# Patient Record
Sex: Female | Born: 2005 | Race: White | Hispanic: No | Marital: Single | Smoking: Never smoker
Health system: Southern US, Community
[De-identification: ages and names within clinical notes are randomized; demographics above are authoritative.]

## PROBLEM LIST (undated history)

## (undated) DIAGNOSIS — L709 Acne, unspecified: Secondary | ICD-10-CM

## (undated) HISTORY — DX: Acne, unspecified: L70.9

---
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
## 2024-02-07 ENCOUNTER — Encounter: Payer: Self-pay | Admitting: Dermatology

## 2024-02-07 ENCOUNTER — Ambulatory Visit: Admitting: Dermatology

## 2024-02-07 DIAGNOSIS — L700 Acne vulgaris: Secondary | ICD-10-CM

## 2024-02-07 MED ORDER — MINOCYCLINE HCL 50 MG PO CAPS: 50.0000 mg | ORAL_CAPSULE | Freq: Two times a day (BID) | ORAL | 3 refills | Status: AC

## 2024-02-07 NOTE — Progress Notes (Signed)
" ° °  Follow-Up Visit   Subjective  Gail Gordon is a 19 y.o. female who presents for the following: Acne Vulgaris. 10 week follow up. Taking Minocycline  100 mg once daily. Uses Aklief  and BPO gel as needed for flares. Patient states her acne is much better controlled. Doxy allergy, but no problems noticed with Minocycline .  Hx of Isotretinoin  therapy May 2024 and finished in February 2025. 28 week course. Total 9900 mg, 150.91 mg/kg.   The following portions of the chart were reviewed this encounter and updated as appropriate: medications, allergies, medical history  Review of Systems:  No other skin or systemic complaints except as noted in HPI or Assessment and Plan.  Objective  Well appearing patient in no apparent distress; mood and affect are within normal limits.  Areas Examined: Face, chest and back  Relevant exam findings are noted in the Assessment and Plan.   Assessment & Plan    ACNE VULGARIS, Previous Isotretinoin  therapy May 2024 and finished in February 2025.  Exam: Closed comedones at forehead and perioral  Chronic condition with duration or expected duration over one year. Currently well-controlled.   Treatment Plan: Decrease Minocycline  to 50 mg 1 capsule once daily with food, may increase to bid with flares prn, if still controlled on lower dose at f/up, will d/c  Increase Aklief  to nightly as tolerated, pea-sized amount to face at bedtime, wash off in morning.    Continue OTC Benzoyl peroxide gel to spot treat active bumps in morning.   Minocycline :  Common side effects include headache, dizziness, GI upset, and fatigue. Rare side effects include bluish hyperpigmentation of skin, gums, teeth, nails; liver inflammation, increased pressure in the brain, and lupus-like syndrome.   Topical retinoid medications like tretinoin /Retin-A , adapalene/Differin/Aklief , tazarotene Lunda, and Epiduo/Epiduo Forte can cause dryness and irritation when first started. Only apply a  pea-sized amount to the entire affected area. Avoid applying it around the eyes, edges of mouth and creases at the nose. If you experience irritation, use a good moisturizer first and/or apply the medicine less often. If you are doing well with the medicine, you can increase how often you use it until you are applying every night. Be careful with sun protection while using this medication as it can make you sensitive to the sun. This medicine should not be used by pregnant women.    Benzoyl peroxide can cause dryness and irritation of the skin. It can also bleach fabric. When used together with Aczone (dapsone) cream, it can stain the skin orange.     Return for Acne Follow Up in 3-4 months.  I, Jill Parcell, CMA, am acting as scribe for Rexene Rattler, MD.   Documentation: I have reviewed the above documentation for accuracy and completeness, and I agree with the above.  Rexene Rattler, MD  "

## 2024-02-07 NOTE — Patient Instructions (Addendum)
 Start Minocycline  50 mg 1 capsule once daily with food. Can increase to twice daily as needed for flares.  Continue Aklief   pea-sized amount to face at bedtime, wash off in morning.    Continue OTC Benzoyl peroxide gel to spot treat active bumps in morning.    Recommend daily broad spectrum sunscreen SPF 30+ to sun-exposed areas, reapply every 2 hours as needed. Call for new or changing lesions.  Staying in the shade or wearing long sleeves, sun glasses (UVA+UVB protection) and wide brim hats (4-inch brim around the entire circumference of the hat) are also recommended for sun protection.      Due to recent changes in healthcare laws, you may see results of your pathology and/or laboratory studies on MyChart before the doctors have had a chance to review them. We understand that in some cases there may be results that are confusing or concerning to you. Please understand that not all results are received at the same time and often the doctors may need to interpret multiple results in order to provide you with the best plan of care or course of treatment. Therefore, we ask that you please give us  2 business days to thoroughly review all your results before contacting the office for clarification. Should we see a critical lab result, you will be contacted sooner.   If You Need Anything After Your Visit  If you have any questions or concerns for your doctor, please call our main line at [phone] and press option 4 to reach your doctor's medical assistant. If no one answers, please leave a voicemail as directed and we will return your call as soon as possible. Messages left after 4 pm will be answered the following business day.   You may also send us  a message via MyChart. We typically respond to MyChart messages within 1-2 business days.  For prescription refills, please ask your pharmacy to contact our office. Our fax number is [phone].  If you have an urgent issue when the clinic is  closed that cannot wait until the next business day, you can page your doctor at the number below.    Please note that while we do our best to be available for urgent issues outside of office hours, we are not available 24/7.   If you have an urgent issue and are unable to reach us , you may choose to seek medical care at your doctor's office, retail clinic, urgent care center, or emergency room.  If you have a medical emergency, please immediately call 911 or go to the emergency department.  Pager Numbers  - Dr. Hester: [phone]  - Dr. Jackquline: [phone]  - Dr. Claudene: [phone]   - Dr. Raymund: [phone]  In the event of inclement weather, please call our main line at [phone] for an update on the status of any delays or closures.  Dermatology Medication Tips: Please keep the boxes that topical medications come in in order to help keep track of the instructions about where and how to use these. Pharmacies typically print the medication instructions only on the boxes and not directly on the medication tubes.   If your medication is too expensive, please contact our office at [phone] option 4 or send us  a message through MyChart.   We are unable to tell what your co-pay for medications will be in advance as this is different depending on your insurance coverage. However, we may be able to find a substitute medication at lower cost or fill out  paperwork to get insurance to cover a needed medication.   If a prior authorization is required to get your medication covered by your insurance company, please allow us  1-2 business days to complete this process.  Drug prices often vary depending on where the prescription is filled and some pharmacies may offer cheaper prices.  The website www.goodrx.com contains coupons for medications through different pharmacies. The prices here do not account for what the cost may be with help from insurance (it may be cheaper with your  insurance), but the website can give you the price if you did not use any insurance.  - You can print the associated coupon and take it with your prescription to the pharmacy.  - You may also stop by our office during regular business hours and pick up a GoodRx coupon card.  - If you need your prescription sent electronically to a different pharmacy, notify our office through [facility] or by phone at [phone] option 4.     Si Usted Necesita Algo Despus de Su Visita  Tambin puede enviarnos un mensaje a travs de Clinical Cytogeneticist. Por lo general respondemos a los mensajes de MyChart en el transcurso de 1 a 2 das hbiles.  Para renovar recetas, por favor pida a su farmacia que se ponga en contacto con nuestra oficina. Randi lakes de fax es [place] [phone].  Si tiene un asunto urgente cuando la clnica est cerrada y que no puede esperar hasta el siguiente da hbil, puede llamar/localizar a su doctor(a) al nmero que aparece a continuacin.   Por favor, tenga en cuenta que aunque hacemos todo lo posible para estar disponibles para asuntos urgentes fuera del horario de [place], no estamos disponibles las 24 horas del da, los 7 [address]  [address] de la [place].   Si tiene un problema urgente y no puede comunicarse con nosotros, puede optar por buscar atencin mdica  en el consultorio de su doctor(a), en una clnica privada, en un centro de atencin urgente o en una sala de emergencias.  Si tiene engineer, drilling, por favor llame inmediatamente al 911 o vaya a la sala de emergencias.  Nmeros de bper  - Dr. Hester: [phone]  - Dra. Jackquline: [phone]  - Dr. Claudene: [phone]  - Dra. Kitts: [phone]  En caso de inclemencias del [place], por favor llame a nuestra lnea principal al [phone] para una actualizacin sobre el estado de cualquier retraso o cierre.  Consejos para la medicacin en dermatologa: Por favor, guarde las cajas en las que vienen los medicamentos  de uso tpico para ayudarle a seguir las instrucciones sobre dnde y cmo usarlos. Las farmacias generalmente imprimen las instrucciones del medicamento slo en las cajas y no directamente en los tubos del [place].   Si su medicamento es muy caro, por favor, pngase en contacto con landry rieger llamando al [phone] y presione la opcin 4 o envenos un mensaje a travs de Clinical Cytogeneticist.   No podemos decirle cul ser su copago por los medicamentos por adelantado ya que esto es diferente dependiendo de la cobertura de su seguro. Sin embargo, es posible que podamos encontrar un medicamento sustituto a audiological scientist un formulario para que el seguro cubra el medicamento que se considera necesario.   Si se requiere una autorizacin previa para que su compaa de seguros cubra su medicamento, por favor permtanos de 1 a 2 das hbiles para completar este proceso.  Los precios de los medicamentos varan con frecuencia dependiendo del environmental consultant de dnde se  surte la receta y alguna farmacias pueden ofrecer precios ms baratos.  El sitio web www.goodrx.com tiene cupones para medicamentos de health and safety inspector. Los precios aqu no tienen en cuenta lo que podra costar con la ayuda del seguro (puede ser ms barato con su seguro), pero el sitio web puede darle el precio si no utiliz tourist information centre manager.  - Puede imprimir el cupn correspondiente y llevarlo con su receta a la farmacia.  - Tambin puede pasar por nuestra oficina durante el horario de atencin regular y education officer, museum una tarjeta de cupones de GoodRx.  - Si necesita que su receta se enve electrnicamente a una farmacia diferente, informe a nuestra oficina a travs de MyChart de [place] o por telfono llamando al [phone] y presione la opcin 4.

## 2024-05-15 ENCOUNTER — Ambulatory Visit: Admitting: Dermatology

## 2024-06-29 ENCOUNTER — Encounter: Admitting: Family Medicine
